# Patient Record
Sex: Male | Born: 2005
Health system: Southern US, Community
[De-identification: ages and names within clinical notes are randomized; demographics above are authoritative.]

---
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
## 2024-02-04 ENCOUNTER — Ambulatory Visit: Payer: Self-pay

## 2024-02-04 ENCOUNTER — Ambulatory Visit
Admission: EM | Admit: 2024-02-04 | Discharge: 2024-02-04 | Disposition: A | Discharge status: Home / Self Care | Attending: Physician Assistant | Admitting: Physician Assistant

## 2024-02-04 DIAGNOSIS — S0096XA Insect bite (nonvenomous) of unspecified part of head, initial encounter: Secondary | ICD-10-CM | POA: Diagnosis not present

## 2024-02-04 DIAGNOSIS — L03113 Cellulitis of right upper limb: Secondary | ICD-10-CM | POA: Diagnosis not present

## 2024-02-04 DIAGNOSIS — W57XXXA Bitten or stung by nonvenomous insect and other nonvenomous arthropods, initial encounter: Secondary | ICD-10-CM | POA: Diagnosis not present

## 2024-02-04 MED ORDER — CEPHALEXIN 500 MG PO CAPS: 500.0000 mg | ORAL_CAPSULE | Freq: Three times a day (TID) | ORAL | 0 refills | Status: AC

## 2024-02-04 MED ORDER — TRIAMCINOLONE ACETONIDE 0.5 % EX OINT: 1.0000 | TOPICAL_OINTMENT | Freq: Two times a day (BID) | CUTANEOUS | 0 refills | Status: DC

## 2024-02-04 NOTE — Discharge Instructions (Addendum)
-   Insect bite versus soft tissue/skin infection. - Apply the topical ointment twice daily as directed and cool compresses to help with swelling.  Warm compresses may help any pus drain out.  You can do both. - Begin antibiotics to cover infection. - If fever, increased swelling, increased discomfort, worsening of symptoms in any way please return for reevaluation.

## 2024-02-04 NOTE — ED Provider Notes (Signed)
 [facility]    CSN: [id number] Arrival date & time: 02/04/24  1007      History   Chief Complaint Chief Complaint  Patient presents with   Insect Bite    HPI Jerry Hall is a 18 y.o. male.   18 year old male presents for an area of redness, swelling and discomfort of the right arm for past couple days.  He does not recall any insect bites or stings.  Denies known tick bites.  He says he mostly stays inside.  Patient says he noticed 1 spot pop up and appeared to be consistent with an acne bump.  However, it has gotten larger and there is now a red area surrounding it.  He has not noticed any pustular drainage.  He has another couple similar spots on the same arm.  He says the areas are tender when touched.  Denies any itching.  Has not treated condition in any way.  HPI  History reviewed. No pertinent past medical history.  There are no active problems to display for this patient.   History reviewed. No pertinent surgical history.     Home Medications    Prior to Admission medications   Medication Sig Start Date End Date Taking? Authorizing Provider  cephALEXin (KEFLEX) 500 MG capsule Take 1 capsule (500 mg total) by mouth 3 (three) times daily for 5 days. 02/04/24 02/09/24 Yes Arvis Huxley B, PA-C  triamcinolone ointment (KENALOG) 0.5 % Apply 1 Application topically 2 (two) times daily. 02/04/24  Yes Arvis Huxley NOVAK PA-C    Family History History reviewed. No pertinent family history.  Social History Social History   Tobacco Use   Smoking status: Never   Smokeless tobacco: Never  Vaping Use   Vaping status: Never Used  Substance Use Topics   Alcohol use: Never   Drug use: Never     Allergies   Patient has no known allergies.   Review of Systems Review of Systems  Constitutional:  Negative for fatigue and fever.  Musculoskeletal:  Negative for arthralgias and joint swelling.  Skin:  Positive for color change and rash.  Neurological:   Negative for weakness and numbness.     Physical Exam Triage Vital Signs ED Triage Vitals  Encounter Vitals Group     BP 02/04/24 1020 107/78     Girls Systolic BP Percentile --      Girls Diastolic BP Percentile --      Boys Systolic BP Percentile --      Boys Diastolic BP Percentile --      Pulse Rate 02/04/24 1020 77     Resp --      Temp 02/04/24 1020 (!) 97.4 F (36.3 C)     Temp Source 02/04/24 1020 Oral     SpO2 02/04/24 1020 99 %     Weight 02/04/24 1022 195 lb (88.5 kg)     Height 02/04/24 1022 6' 1 (1.854 m)     Head Circumference --      Peak Flow --      Pain Score 02/04/24 1019 4     Pain Loc --      Pain Education --      Exclude from Growth Chart --    No data found.  Updated Vital Signs BP 107/78 (BP Location: Left Arm)   Pulse 77   Temp (!) 97.4 F (36.3 C) (Oral)   Ht 6' 1 (1.854 m)   Wt 195 lb (88.5 kg)  SpO2 99%   BMI 25.73 kg/m      Physical Exam Vitals and nursing note reviewed.  Constitutional:      General: He is not in acute distress.    Appearance: Normal appearance. He is well-developed. He is not ill-appearing.  HENT:     Head: Normocephalic and atraumatic.   Eyes:     General: No scleral icterus.    Conjunctiva/sclera: Conjunctivae normal.    Cardiovascular:     Rate and Rhythm: Normal rate.  Pulmonary:     Effort: Pulmonary effort is normal. No respiratory distress.   Musculoskeletal:     Cervical back: Neck supple.   Skin:    General: Skin is warm and dry.     Capillary Refill: Capillary refill takes less than 2 seconds.     Findings: Erythema and rash present.     Comments: Right arm: There is a pustule with surrounding erythema and tenderness to palpation.  Area of erythema is about 3 cm x 4 cm.  2 other similar areas of erythema without pustule.    Neurological:     General: No focal deficit present.     Mental Status: He is alert. Mental status is at baseline.     Motor: No weakness.     Gait: Gait normal.    Psychiatric:        Mood and Affect: Mood normal.        Behavior: Behavior normal.      UC Treatments / Results  Labs (all labs ordered are listed, but only abnormal results are displayed) Labs Reviewed - No data to display  EKG   Radiology No results found.  Procedures Procedures (including critical care time)  Medications Ordered in UC Medications - No data to display  Initial Impression / Assessment and Plan / UC Course  I have reviewed the triage vital signs and the nursing notes.  Pertinent labs & imaging results that were available during my care of the patient were reviewed by me and considered in my medical decision making (see chart for details).   18 year old male presents for ration redness of an area of the right arm for the past couple days.  The area is tender.  He does not recall any insect bites, stings or tick bites.  No treatment attempted so far.  Cellulitis versus insect bite.  Treating at this time with topical triamcinolone ointment and oral Keflex.  Advised cool and warm compresses.  Reviewed return precautions.    Final Clinical Impressions(s) / UC Diagnoses   Final diagnoses:  Cellulitis of right upper extremity  Insect bite of head, unspecified part, initial encounter     Discharge Instructions      - Insect bite versus soft tissue/skin infection. - Apply the topical ointment twice daily as directed and cool compresses to help with swelling.  Warm compresses may help any pus drain out.  You can do both. - Begin antibiotics to cover infection. - If fever, increased swelling, increased discomfort, worsening of symptoms in any way please return for reevaluation.   ED Prescriptions     Medication Sig Dispense Auth. Provider   triamcinolone ointment (KENALOG) 0.5 % Apply 1 Application topically 2 (two) times daily. 30 g Arvis Huxley B, PA-C   cephALEXin (KEFLEX) 500 MG capsule Take 1 capsule (500 mg total) by mouth 3 (three) times  daily for 5 days. 15 capsule Arvis Huxley NOVAK, PA-C      PDMP not reviewed this encounter.  Arvis Jolan NOVAK, PA-C 02/04/24 1048

## 2024-02-04 NOTE — ED Triage Notes (Addendum)
 Pt is with his mom  Pt c/o Bug bite x1day  Pt states that he noticed the bites while sitting in his room.   Pt denies any outdoor activities and pt mother states he does not go outside and just stays indoors to play videogames  Pt mother states that no one else in the home has bug bites.  Pt has bumps along the lateral side of the right forearm below the elbow and states that they are tender to touch.

## 2024-07-17 ENCOUNTER — Ambulatory Visit
Admission: EM | Admit: 2024-07-17 | Discharge: 2024-07-17 | Disposition: A | Discharge status: Home / Self Care | Payer: Self-pay | Attending: Emergency Medicine | Admitting: Emergency Medicine

## 2024-07-17 DIAGNOSIS — J019 Acute sinusitis, unspecified: Secondary | ICD-10-CM

## 2024-07-17 DIAGNOSIS — R051 Acute cough: Secondary | ICD-10-CM

## 2024-07-17 MED ORDER — AMOXICILLIN-POT CLAVULANATE 875-125 MG PO TABS: 1.0000 | ORAL_TABLET | Freq: Two times a day (BID) | ORAL | 0 refills | Status: AC

## 2024-07-17 MED ORDER — IPRATROPIUM BROMIDE 0.06 % NA SOLN: 2.0000 | Freq: Four times a day (QID) | NASAL | 0 refills | Status: AC

## 2024-07-17 MED ORDER — PROMETHAZINE-DM 6.25-15 MG/5ML PO SYRP: 5.0000 mL | ORAL_SOLUTION | Freq: Four times a day (QID) | ORAL | 0 refills | Status: AC | PRN

## 2024-07-17 MED ORDER — IBUPROFEN 600 MG PO TABS: 600.0000 mg | ORAL_TABLET | Freq: Three times a day (TID) | ORAL | 0 refills | Status: AC | PRN

## 2024-07-17 NOTE — Discharge Instructions (Signed)
 Start Mucinex-D to keep the mucous thin and to decongest you.   You may take 600 mg of motrin with 1000 mg of tylenol up to 3-4 times a day as needed for pain. This is an effective combination for pain.  Most sinus infections are viral and do not need antibiotics unless you have a high fever, have had this for 10 days, or you get better and then get sick again.  I would wait several days to start the Augmentin.  Use a NeilMed sinus rinse with distilled water as often as you want to to reduce nasal congestion. Follow the directions on the box.  Atrovent nasal spray for the postnasal drip, Promethazine DM for the cough.  Go to www.goodrx.com to look up your medications. This will give you a list of where you can find your prescriptions at the most affordable prices. Or you can ask the pharmacist what the cash price is. This is frequently cheaper than going through insurance.

## 2024-07-17 NOTE — ED Triage Notes (Signed)
 Pt c/o sinus pressure,cough,HA & fatigue x1 wk. Has tried OTC meds w/o relief.

## 2024-07-17 NOTE — ED Provider Notes (Signed)
 HPI  SUBJECTIVE:  Jerry Hall is a 18 y.o. male who presents with 1 week of feeling feverish with nasal congestion, mucoid rhinorrhea, headache, sinus pain and pressure along the bridge of his nose between his eyes, sore throat, postnasal drip, dry cough.  Reports double sickening.  States he is unable to sleep at night because of the cough.  No abdominal pain, rash, drooling, trismus, sensation of throat swelling shut, neck stiffness.  No known strep, mono exposure.  No allergy or GERD symptoms.  No antibiotics in the past month.  He took ibuprofen 400 mg twice daily and DayQuil with improvement of symptoms.  Last dose of ibuprofen was within the past 6 hours.  No aggravating factors.  He has no past medical history.  PCP: Cannot remember.    History reviewed. No pertinent past medical history.  History reviewed. No pertinent surgical history.  History reviewed. No pertinent family history.  Social History   Tobacco Use   Smoking status: Never   Smokeless tobacco: Never  Vaping Use   Vaping status: Never Used  Substance Use Topics   Alcohol use: Never   Drug use: Never    No current facility-administered medications for this encounter.  Current Outpatient Medications:    amoxicillin-clavulanate (AUGMENTIN) 875-125 MG tablet, Take 1 tablet by mouth every 12 (twelve) hours., Disp: 14 tablet, Rfl: 0   ibuprofen (ADVIL) 600 MG tablet, Take 1 tablet (600 mg total) by mouth every 8 (eight) hours as needed., Disp: 30 tablet, Rfl: 0   ipratropium (ATROVENT) 0.06 % nasal spray, Place 2 sprays into both nostrils 4 (four) times daily., Disp: 15 mL, Rfl: 0   promethazine-dextromethorphan (PROMETHAZINE-DM) 6.25-15 MG/5ML syrup, Take 5 mLs by mouth 4 (four) times daily as needed for cough., Disp: 118 mL, Rfl: 0  No Known Allergies   ROS  As noted in HPI.   Physical Exam  BP 115/76 (BP Location: Left Arm)   Pulse 62   Temp 98.6 F (37 C) (Oral)   Resp 16   Wt 99.2 kg   SpO2  97%   BMI 28.84 kg/m   Constitutional: Well developed, well nourished, no acute distress.  Deep cough. Eyes: PERRL, EOMI, conjunctiva normal bilaterally HENT: Normocephalic, atraumatic,mucus membranes moist.  Purulent nasal congestion.  Erythematous, but not swollen turbinates.  No maxillary, frontal sinus tenderness.  Purulent postnasal drip.  Intensely erythematous oropharynx.  Tonsils normal size without exudates.  Uvula midline. Neck: No cervical lymphadenopathy  Respiratory: Clear to auscultation bilaterally, no rales, no wheezing, no rhonchi.  No anterior, lateral chest wall tenderness Cardiovascular: Normal rate and rhythm, no murmurs, no gallops, no rubs GI: nondistended soft, no splenomegaly skin: No rash, skin intact Musculoskeletal: no deformities Neurologic: Alert & oriented x 3, CN III-XII grossly intact, no motor deficits, sensation grossly intact Psychiatric: Speech and behavior appropriate   ED Course   Medications - No data to display  No orders of the defined types were placed in this encounter.  No results found for this or any previous visit (from the past 24 hours). No results found.  ED Clinical Impression  1. Acute non-recurrent sinusitis, unspecified location   2. Acute cough      ED Assessment/Plan     Presentation consistent with upper respiratory infection but I am concerned that it is turning to a sinusitis.  He reports double sickening and has purulent nasal drainage on exam.  Will send home with Mucinex D, saline nasal irrigation, Tylenol combined with ibuprofen  for the nasal congestion and Atrovent nasal spray for the postnasal drip, Promethazine DM for cough.  Wait-and-see prescription of Augmentin if he is not better in 3 days or if he starts to get worse.  Discussed MDM, treatment plan, and plan for follow-up with patient.  He agrees with plan. Meds ordered this encounter  Medications   ibuprofen (ADVIL) 600 MG tablet    Sig: Take 1 tablet  (600 mg total) by mouth every 8 (eight) hours as needed.    Dispense:  30 tablet    Refill:  0   amoxicillin-clavulanate (AUGMENTIN) 875-125 MG tablet    Sig: Take 1 tablet by mouth every 12 (twelve) hours.    Dispense:  14 tablet    Refill:  0   ipratropium (ATROVENT) 0.06 % nasal spray    Sig: Place 2 sprays into both nostrils 4 (four) times daily.    Dispense:  15 mL    Refill:  0   promethazine-dextromethorphan (PROMETHAZINE-DM) 6.25-15 MG/5ML syrup    Sig: Take 5 mLs by mouth 4 (four) times daily as needed for cough.    Dispense:  118 mL    Refill:  0      *This clinic note was created using Scientist, clinical (histocompatibility and immunogenetics). Therefore, there may be occasional mistakes despite careful proofreading. ?    Van Knee, MD 07/17/24 1757
